# Patient Record
Sex: Male | Born: 1960 | Race: White | Hispanic: No | Marital: Married
Health system: Midwestern US, Community
[De-identification: ages and names within clinical notes are randomized; demographics above are authoritative.]

## PROBLEM LIST (undated history)

## (undated) DIAGNOSIS — E785 Hyperlipidemia, unspecified: Principal | ICD-10-CM

## (undated) DIAGNOSIS — R072 Precordial pain: Principal | ICD-10-CM

---

## 2005-10-04 ENCOUNTER — Ambulatory Visit (HOSPITAL_COMMUNITY): Admission: RE | Admit: 2005-10-04 | Discharge: 2005-10-05 | Payer: Self-pay | Admitting: Specialist

## 2006-03-21 ENCOUNTER — Ambulatory Visit: Payer: Self-pay | Admitting: Pulmonary Disease

## 2006-04-19 ENCOUNTER — Ambulatory Visit (HOSPITAL_BASED_OUTPATIENT_CLINIC_OR_DEPARTMENT_OTHER): Admission: RE | Admit: 2006-04-19 | Discharge: 2006-04-19 | Payer: Self-pay | Admitting: Pulmonary Disease

## 2006-04-19 ENCOUNTER — Ambulatory Visit: Payer: Self-pay | Admitting: Pulmonary Disease

## 2006-05-05 IMAGING — CR DG LUMBAR SPINE 1V
1 series · 1 of 1 positions shown · non-contrast
Comparison: none

CLINICAL DATA: Lumbar disc herniation. 
 LUMBAR SPINE ? 2 VIEW:

[view not recorded]
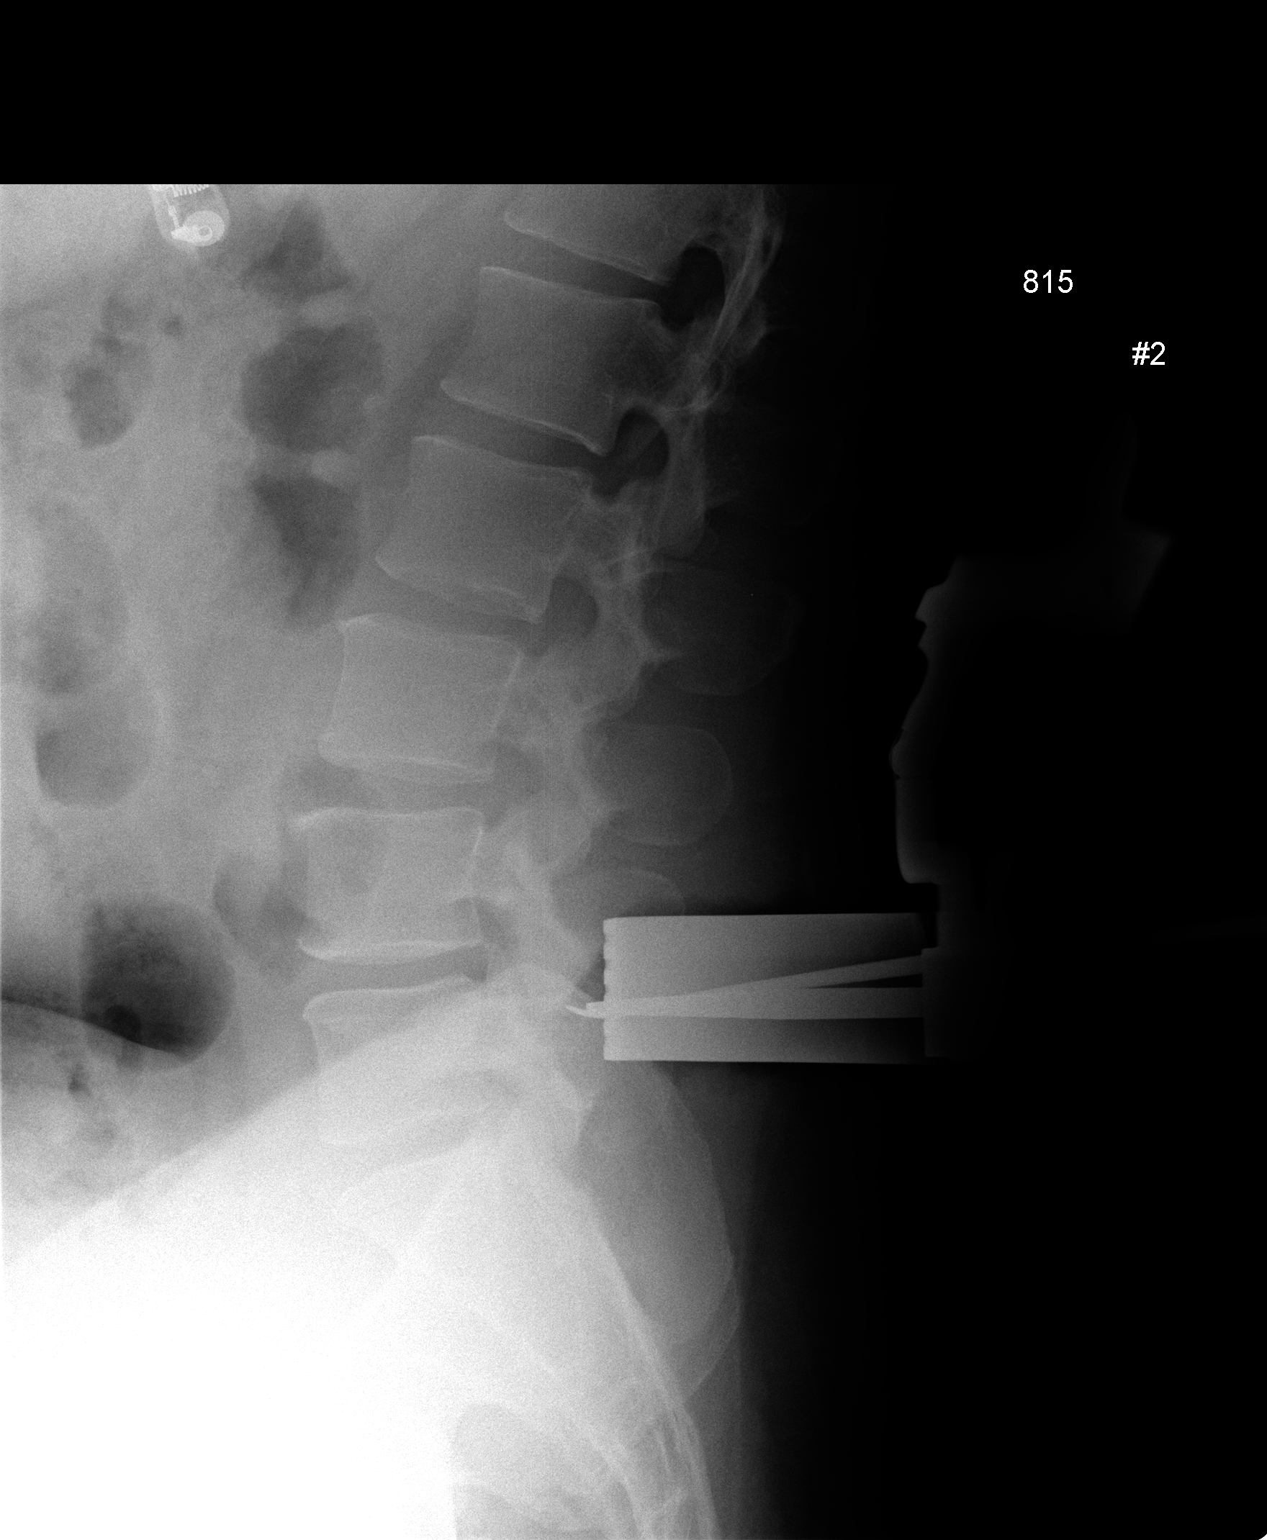

[1 of 1 positions shown; findings below may reference images not displayed]

FINDINGS: Intraoperative crosstable lateral radiograph #1 shows needles posteriorly directed at the spinous processes of L4 and S1.
 Intraoperative crosstable lateral radiograph #2 shows posterior retractors with an instrument overlying the spinal canal at the level of L4-5.
IMPRESSION: Intraoperative localization of L4-5.

## 2006-05-13 ENCOUNTER — Ambulatory Visit: Payer: Self-pay | Admitting: Pulmonary Disease

## 2007-12-23 ENCOUNTER — Telehealth (INDEPENDENT_AMBULATORY_CARE_PROVIDER_SITE_OTHER): Payer: Self-pay | Admitting: *Deleted

## 2017-10-02 ENCOUNTER — Telehealth: Payer: Self-pay | Admitting: Pulmonary Disease

## 2017-10-02 NOTE — Telephone Encounter (Signed)
Called and spoke with pt to see when it was that he had a HST done.  Pt stated to me that it was done sometime in 2007.  Looked through pt's chart to see if he was seen by one of our pulmonologists but pt has never been seen at our office.    Pt was admitted and [facility] from [facility].  There were CEMR conversation encounters that were signed by Dr. Craige CottaSood and one of those encounters was from the sleep disorders center.  Pt is stating that he needs to have his sleep study sent to [facility].  Advised pt that since it has been since 2007 since this was done, there might be problems getting ahold of his sleep study.  Called the sleep center to see if they had records of pt's sleep study and they saw where pt had the sleep study done in July 2007 but they had no records of pt's sleep study.  Called medical records and spoke with Marylu LundJanet to see if she could be able to find pt's sleep study. Marylu LundJanet said that she would have to pull up pt's paper chart to see if the sleep study is in there. Will send Marylu LundJanet an email to see if she can pull pt's sleep study so we can send it to [facility].  Will leave this encounter open until we are able to see if we can get pt's sleep study to send to the proper person.

## 2017-10-03 NOTE — Telephone Encounter (Signed)
Called and spoke with pt regarding to sleep study.  I have advised pt to contact medical records to obtain records. We are not able obtain sleep study, and pt has not been seen in our office. It appears that [facility] read the sleep study in 2007. Pt has been provided with medical records number. Pt voiced his understanding and had  No further questions. Nothing further is needed.

## 2017-10-07 NOTE — Telephone Encounter (Signed)
[facility] stating that the were unable to locate pt's paper chart. Marylu LundJanet stated to me that pt had called Medical Records and was told that paper chart was unable to be located.  Called pt and left him a message with this same information and told him to contact us if he has any questions.  Nothing further needed at this time.

## 2020-02-25 DIAGNOSIS — R0789 Other chest pain: Secondary | ICD-10-CM

## 2020-02-25 NOTE — ED Triage Notes (Signed)
Patient arrives via POV with spouse with mask in place.  States he played golf yesterday and started with chest/shoulder pain yesterday evening.  Also c/o nausea and loss of appetite. Currently with headache.  Denis SOB.

## 2020-02-25 NOTE — ED Notes (Signed)
Patient arrives via POV with spouse with mask in place.  States he played golf yesterday and started with chest/shoulder pain yesterday evening.  Also c/o nausea and loss of appetite. Currently with headache.  Denis SOB.

## 2020-02-26 ENCOUNTER — Inpatient Hospital Stay
Admit: 2020-02-26 | Discharge: 2020-02-26 | Disposition: A | Discharge status: Home / Self Care | Payer: BLUE CROSS/BLUE SHIELD | Attending: Emergency Medicine

## 2020-02-26 LAB — CBC WITH AUTO DIFFERENTIAL
Basophils %: 1 % (ref 0.0–2.0)
Basophils Absolute: 0.1 10*3/uL (ref 0.0–0.2)
Eosinophils %: 3 % (ref 0.5–7.8)
Eosinophils Absolute: 0.3 10*3/uL (ref 0.0–0.8)
Granulocyte Absolute Count: 0 10*3/uL (ref 0.0–0.5)
Hematocrit: 41.6 % (ref 41.1–50.3)
Hemoglobin: 14.3 g/dL (ref 13.6–17.2)
Immature Granulocytes: 0 % (ref 0.0–5.0)
Lymphocytes %: 29 % (ref 13–44)
Lymphocytes Absolute: 3.5 10*3/uL (ref 0.5–4.6)
MCH: 31 PG (ref 26.1–32.9)
MCHC: 34.4 g/dL (ref 31.4–35.0)
MCV: 90.2 FL (ref 79.6–97.8)
MPV: 10.2 FL (ref 9.4–12.3)
Monocytes %: 8 % (ref 4.0–12.0)
Monocytes Absolute: 0.9 10*3/uL (ref 0.1–1.3)
NRBC Absolute: 0 10*3/uL (ref 0.0–0.2)
Neutrophils %: 59 % (ref 43–78)
Neutrophils Absolute: 7 10*3/uL (ref 1.7–8.2)
Platelets: 257 10*3/uL (ref 150–450)
RBC: 4.61 M/uL (ref 4.23–5.6)
RDW: 13 % (ref 11.9–14.6)
WBC: 11.8 10*3/uL — ABNORMAL HIGH (ref 4.3–11.1)

## 2020-02-26 LAB — CK
CK: 508 U/L — ABNORMAL HIGH (ref 21–215)
Total CK: 508 U/L — ABNORMAL HIGH (ref 21–215)

## 2020-02-26 LAB — COMPREHENSIVE METABOLIC PANEL
ALT: 33 U/L (ref 12–65)
AST: 35 U/L (ref 15–37)
Albumin/Globulin Ratio: 0.9 — ABNORMAL LOW (ref 1.2–3.5)
Albumin: 3.6 g/dL (ref 3.5–5.0)
Alkaline Phosphatase: 82 U/L (ref 50–136)
Anion Gap: 9 mmol/L (ref 7–16)
BUN: 22 MG/DL (ref 6–23)
CO2: 24 mmol/L (ref 21–32)
Calcium: 8.5 MG/DL (ref 8.3–10.4)
Chloride: 108 mmol/L — ABNORMAL HIGH (ref 98–107)
Creatinine: 1.13 MG/DL (ref 0.8–1.5)
EGFR IF NonAfrican American: >60 mL/min/{1.73_m2} (ref >60)
GFR African American: >60 mL/min/{1.73_m2} (ref >60)
Globulin: 3.9 g/dL — ABNORMAL HIGH (ref 2.3–3.5)
Glucose: 110 mg/dL — ABNORMAL HIGH (ref 65–100)
Potassium: 4.1 mmol/L (ref 3.5–5.1)
Sodium: 141 mmol/L (ref 136–145)
Total Bilirubin: 0.5 MG/DL (ref 0.2–1.1)
Total Protein: 7.5 g/dL (ref 6.3–8.2)

## 2020-02-26 LAB — EKG 12-LEAD
Atrial Rate: 75 {beats}/min
Diagnosis: NORMAL
P Axis: 70 degrees
P-R Interval: 150 ms
Q-T Interval: 386 ms
QRS Duration: 78 ms
QTc Calculation (Bazett): 431 ms
R Axis: 40 degrees
T Axis: 36 degrees
Ventricular Rate: 75 {beats}/min

## 2020-02-26 LAB — TROPONIN, HIGH SENSITIVITY
Troponin, High Sensitivity: 8.1 pg/mL (ref 0–14)
Troponin, High Sensitivity: 8.3 pg/mL (ref 0–14)

## 2020-02-26 LAB — LIPASE
Lipase: 129 U/L (ref 73–393)
Lipase: 129 U/L (ref 73–393)

## 2020-02-26 LAB — MAGNESIUM
Magnesium: 2.1 mg/dL (ref 1.8–2.4)
Magnesium: 2.1 mg/dL (ref 1.8–2.4)

## 2020-02-26 LAB — PROBNP, N-TERMINAL: BNP: 20 PG/ML (ref 5–125)

## 2020-02-26 LAB — D-DIMER, QUANTITATIVE: D-Dimer, Quant: <0.27 ug/ml(FEU) (ref <0.56)

## 2020-02-26 LAB — CBC WITH AUTOMATED DIFF
ABS. BASOPHILS: 0.1 10*3/uL (ref 0.0–0.2)
ABS. EOSINOPHILS: 0.3 10*3/uL (ref 0.0–0.8)
ABS. IMM. GRANS.: 0 10*3/uL (ref 0.0–0.5)
ABS. LYMPHOCYTES: 3.5 10*3/uL (ref 0.5–4.6)
ABS. MONOCYTES: 0.9 10*3/uL (ref 0.1–1.3)
ABS. NEUTROPHILS: 7 10*3/uL (ref 1.7–8.2)
ABSOLUTE NRBC: 0 10*3/uL (ref 0.0–0.2)
BASOPHILS: 1 % (ref 0.0–2.0)
EOSINOPHILS: 3 % (ref 0.5–7.8)
HCT: 41.6 % (ref 41.1–50.3)
HGB: 14.3 g/dL (ref 13.6–17.2)
IMMATURE GRANULOCYTES: 0 % (ref 0.0–5.0)
LYMPHOCYTES: 29 % (ref 13–44)
MCH: 31 PG (ref 26.1–32.9)
MCHC: 34.4 g/dL (ref 31.4–35.0)
MCV: 90.2 FL (ref 79.6–97.8)
MONOCYTES: 8 % (ref 4.0–12.0)
MPV: 10.2 FL (ref 9.4–12.3)
NEUTROPHILS: 59 % (ref 43–78)
PLATELET: 257 10*3/uL (ref 150–450)
RBC: 4.61 M/uL (ref 4.23–5.6)
RDW: 13 % (ref 11.9–14.6)
WBC: 11.8 10*3/uL — ABNORMAL HIGH (ref 4.3–11.1)

## 2020-02-26 LAB — METABOLIC PANEL, COMPREHENSIVE
A-G Ratio: 0.9 — ABNORMAL LOW (ref 1.2–3.5)
ALT (SGPT): 33 U/L (ref 12–65)
AST (SGOT): 35 U/L (ref 15–37)
Albumin: 3.6 g/dL (ref 3.5–5.0)
Alk. phosphatase: 82 U/L (ref 50–136)
Anion gap: 9 mmol/L (ref 7–16)
BUN: 22 MG/DL (ref 6–23)
Bilirubin, total: 0.5 MG/DL (ref 0.2–1.1)
CO2: 24 mmol/L (ref 21–32)
Calcium: 8.5 MG/DL (ref 8.3–10.4)
Chloride: 108 mmol/L — ABNORMAL HIGH (ref 98–107)
Creatinine: 1.13 MG/DL (ref 0.8–1.5)
GFR est AA: >60 mL/min/{1.73_m2} (ref >60)
GFR est non-AA: >60 mL/min/{1.73_m2} (ref >60)
Globulin: 3.9 g/dL — ABNORMAL HIGH (ref 2.3–3.5)
Glucose: 110 mg/dL — ABNORMAL HIGH (ref 65–100)
Potassium: 4.1 mmol/L (ref 3.5–5.1)
Protein, total: 7.5 g/dL (ref 6.3–8.2)
Sodium: 141 mmol/L (ref 136–145)

## 2020-02-26 LAB — EKG, 12 LEAD, INITIAL
Atrial Rate: 75 {beats}/min
Calculated P Axis: 70 degrees
Calculated R Axis: 40 degrees
Calculated T Axis: 36 degrees
Diagnosis: NORMAL
P-R Interval: 150 ms
Q-T Interval: 386 ms
QRS Duration: 78 ms
QTC Calculation (Bezet): 431 ms
Ventricular Rate: 75 {beats}/min

## 2020-02-26 LAB — TROPONIN-HIGH SENSITIVITY
Troponin-High Sensitivity: 8.1 pg/mL (ref 0–14)
Troponin-High Sensitivity: 8.3 pg/mL (ref 0–14)

## 2020-02-26 LAB — D DIMER: D DIMER: <0.27 ug/ml(FEU) (ref <0.56)

## 2020-02-26 LAB — NT-PRO BNP: NT pro-BNP: 20 PG/ML (ref 5–125)

## 2020-02-26 MED ORDER — SODIUM CHLORIDE 0.9 % IV
Freq: Once | INTRAVENOUS | Status: AC
Start: 2020-02-26 — End: 2020-02-26
  Administered 2020-02-26: 05:00:00 via INTRAVENOUS

## 2020-02-26 MED ORDER — SODIUM CHLORIDE 0.9 % IJ SYRG
INTRAMUSCULAR | Status: DC | PRN
Start: 2020-02-26 — End: 2020-02-26

## 2020-02-26 MED ORDER — LANSOPRAZOLE 30 MG CAP, DELAYED RELEASE
30 mg | ORAL_CAPSULE | Freq: Every day | ORAL | 0 refills | Status: AC
Start: 2020-02-26 — End: 2020-03-17

## 2020-02-26 MED ORDER — SODIUM CHLORIDE 0.9 % IJ SYRG
Freq: Three times a day (TID) | INTRAMUSCULAR | Status: DC
Start: 2020-02-26 — End: 2020-02-26

## 2020-02-26 MED ORDER — ASPIRIN 81 MG CHEWABLE TAB
81 mg | ORAL_TABLET | Freq: Every day | ORAL | 0 refills | Status: AC
Start: 2020-02-26 — End: ?

## 2020-02-26 NOTE — ED Provider Notes (Signed)
58-year-old male complaint of chest pain shortness of breath.  Patient was playing golf in the sun 24 hours ago and since then has felt very tired dehydrated having some pain.  Patient also describes his arms as feeling after going to "explode".      Chest Pain (Angina)   This is a new problem. The current episode started yesterday. The problem has not changed since onset.The problem occurs constantly. The pain is associated with normal activity and lifting arms. The pain is at a severity of 5/10. The pain is moderate. The quality of the pain is described as dull. The pain does not radiate. The symptoms are aggravated by movement. Associated symptoms include cough, malaise/fatigue and shortness of breath. Pertinent negatives include no abdominal pain, no diaphoresis, no dizziness and no palpitations. Risk factors include male gender and hypertension.        No past medical history on file.    No past surgical history on file.      No family history on file.    Social History     Socioeconomic History   ??? Marital status: MARRIED     Spouse name: Not on file   ??? Number of children: Not on file   ??? Years of education: Not on file   ??? Highest education level: Not on file   Occupational History   ??? Not on file   Tobacco Use   ??? Smoking status: Not on file   Substance and Sexual Activity   ??? Alcohol use: Not on file   ??? Drug use: Not on file   ??? Sexual activity: Not on file   Other Topics Concern   ??? Not on file   Social History Narrative   ??? Not on file     Social Determinants of Health     Financial Resource Strain:    ??? Difficulty of Paying Living Expenses:    Food Insecurity:    ??? Worried About Running Out of Food in the Last Year:    ??? Ran Out of Food in the Last Year:    Transportation Needs:    ??? Lack of Transportation (Medical):    ??? Lack of Transportation (Non-Medical):    Physical Activity:    ??? Days of Exercise per Week:    ??? Minutes of Exercise per Session:    Stress:    ??? Feeling of Stress :    Social  Connections:    ??? Frequency of Communication with Friends and Family:    ??? Frequency of Social Gatherings with Friends and Family:    ??? Attends Religious Services:    ??? Active Member of Clubs or Organizations:    ??? Attends Club or Organization Meetings:    ??? Marital Status:    Intimate Partner Violence:    ??? Fear of Current or Ex-Partner:    ??? Emotionally Abused:    ??? Physically Abused:    ??? Sexually Abused:          ALLERGIES: Patient has no allergy information on record.    Review of Systems   Constitutional: Positive for malaise/fatigue. Negative for activity change and diaphoresis.   HENT: Negative.    Eyes: Negative.    Respiratory: Positive for cough and shortness of breath.    Cardiovascular: Positive for chest pain. Negative for palpitations.   Gastrointestinal: Negative.  Negative for abdominal pain.   Genitourinary: Negative.    Musculoskeletal: Negative.    Skin: Negative.      Neurological: Negative.  Negative for dizziness.   Psychiatric/Behavioral: Negative.    All other systems reviewed and are negative.      Vitals:    02/25/20 2132   BP: (!) 148/74   Pulse: 77   Resp: 17   Temp: 98.1 ??F (36.7 ??C)   SpO2: 94%   Weight: 122.5 kg (270 lb)   Height: 5\' 9"  (1.753 m)            Physical Exam  Vitals and nursing note reviewed.   Constitutional:       General: He is not in acute distress.     Appearance: He is well-developed.   HENT:      Head: Normocephalic and atraumatic.      Right Ear: External ear normal.      Left Ear: External ear normal.      Nose: Nose normal.   Eyes:      General: No scleral icterus.        Right eye: No discharge.         Left eye: No discharge.      Conjunctiva/sclera: Conjunctivae normal.      Pupils: Pupils are equal, round, and reactive to light.   Cardiovascular:      Rate and Rhythm: Regular rhythm.   Pulmonary:      Effort: Pulmonary effort is normal. No respiratory distress.      Breath sounds: Normal breath sounds. No stridor. No wheezing or rales.   Abdominal:       General: Bowel sounds are normal. There is no distension.      Palpations: Abdomen is soft.      Tenderness: There is no abdominal tenderness.   Musculoskeletal:         General: Normal range of motion.      Cervical back: Normal range of motion.   Skin:     General: Skin is warm and dry.      Findings: No rash.   Neurological:      Mental Status: He is alert and oriented to person, place, and time.      Motor: No abnormal muscle tone.      Coordination: Coordination normal.   Psychiatric:         Behavior: Behavior normal.          MDM  Number of Diagnoses or Management Options  Atypical chest pain  Dehydration  Elevated CK  Diagnosis management comments: Patient had elevated CK most likely from playing golf in the hot sun.  He was hydrated in the emergency department chest pain was relieved in his work-up was negative.    Differential diagnosis: AMI, angina, pleuritic chest pain, musculoskeletal pain.  This list may not include all the diagnosis is considered.       Amount and/or Complexity of Data Reviewed  Clinical lab tests: ordered and reviewed  Tests in the radiology section of CPT??: ordered and reviewed  Tests in the medicine section of CPT??: reviewed and ordered  Decide to obtain previous medical records or to obtain history from someone other than the patient: yes  Review and summarize past medical records: yes  Independent visualization of images, tracings, or specimens: yes    Risk of Complications, Morbidity, and/or Mortality  Presenting problems: high  Diagnostic procedures: high  Management options: high    Patient Progress  Patient progress: stable         Procedures

## 2020-02-26 NOTE — ED Notes (Signed)
I have reviewed discharge instructions with the patient.  The patient verbalized understanding.    Patient left ED via Discharge Method: ambulatory to Home family    Opportunity for questions and clarification provided.       Patient given 2 scripts.         To continue your aftercare when you leave the hospital, you may receive an automated call from our care team to check in on how you are doing.  This is a free service and part of our promise to provide the best care and service to meet your aftercare needs.??? If you have questions, or wish to unsubscribe from this service please call [phone].  Thank you for Choosing our [facility].

## 2020-02-26 NOTE — ED Notes (Signed)
I have reviewed discharge instructions with the patient.  The patient verbalized understanding.    Patient left ED via Discharge Method: ambulatory to Home family  Opportunity for questions and clarification provided.       Patient given 2 scripts.         To continue your aftercare when you leave the hospital, you may receive an automated call from our care team to check in on how you are doing.  This is a free service and part of our promise to provide the best care and service to meet your aftercare needs." If you have questions, or wish to unsubscribe from this service please call [phone].  Thank you for Choosing our [facility].

## 2020-02-26 NOTE — ED Provider Notes (Signed)
59 year old male complaint of chest pain shortness of breath.  Patient was playing golf in the sun 24 hours ago and since then has felt very tired dehydrated having some pain.  Patient also describes his arms as feeling after going to "explode".      Chest Pain (Angina)   This is a new problem. The current episode started yesterday. The problem has not changed since onset.The problem occurs constantly. The pain is associated with normal activity and lifting arms. The pain is at a severity of 5/10. The pain is moderate. The quality of the pain is described as dull. The pain does not radiate. The symptoms are aggravated by movement. Associated symptoms include cough, malaise/fatigue and shortness of breath. Pertinent negatives include no abdominal pain, no diaphoresis, no dizziness and no palpitations. Risk factors include male gender and hypertension.        No past medical history on file.    No past surgical history on file.      No family history on file.    Social History     Socioeconomic History   ??? Marital status: MARRIED     Spouse name: Not on file   ??? Number of children: Not on file   ??? Years of education: Not on file   ??? Highest education level: Not on file   Occupational History   ??? Not on file   Tobacco Use   ??? Smoking status: Not on file   Substance and Sexual Activity   ??? Alcohol use: Not on file   ??? Drug use: Not on file   ??? Sexual activity: Not on file   Other Topics Concern   ??? Not on file   Social History Narrative   ??? Not on file     Social Determinants of Health     Financial Resource Strain:    ??? Difficulty of Paying Living Expenses:    Food Insecurity:    ??? Worried About Charity fundraiser in the Last Year:    ??? Arboriculturist in the Last Year:    Transportation Needs:    ??? Film/video editor (Medical):    ??? Lack of Transportation (Non-Medical):    Physical Activity:    ??? Days of Exercise per Week:    ??? Minutes of Exercise per Session:    Stress:    ??? Feeling of Stress :    Social  Connections:    ??? Frequency of Communication with Friends and Family:    ??? Frequency of Social Gatherings with Friends and Family:    ??? Attends Religious Services:    ??? Marine scientist or Organizations:    ??? Attends Music therapist:    ??? Marital Status:    Intimate Production manager Violence:    ??? Fear of Current or Ex-Partner:    ??? Emotionally Abused:    ??? Physically Abused:    ??? Sexually Abused:          ALLERGIES: Patient has no allergy information on record.    Review of Systems   Constitutional: Positive for malaise/fatigue. Negative for activity change and diaphoresis.   HENT: Negative.    Eyes: Negative.    Respiratory: Positive for cough and shortness of breath.    Cardiovascular: Positive for chest pain. Negative for palpitations.   Gastrointestinal: Negative.  Negative for abdominal pain.   Genitourinary: Negative.    Musculoskeletal: Negative.    Skin: Negative.  Neurological: Negative.  Negative for dizziness.   Psychiatric/Behavioral: Negative.    All other systems reviewed and are negative.      Vitals:    02/25/20 2132   BP: (!) 148/74   Pulse: 77   Resp: 17   Temp: 98.1 ??F (36.7 ??C)   SpO2: 94%   Weight: 122.5 kg (270 lb)   Height: 5\' 9"  (1.753 m)            Physical Exam  Vitals and nursing note reviewed.   Constitutional:       General: He is not in acute distress.     Appearance: He is well-developed.   HENT:      Head: Normocephalic and atraumatic.      Right Ear: External ear normal.      Left Ear: External ear normal.      Nose: Nose normal.   Eyes:      General: No scleral icterus.        Right eye: No discharge.         Left eye: No discharge.      Conjunctiva/sclera: Conjunctivae normal.      Pupils: Pupils are equal, round, and reactive to light.   Cardiovascular:      Rate and Rhythm: Regular rhythm.   Pulmonary:      Effort: Pulmonary effort is normal. No respiratory distress.      Breath sounds: Normal breath sounds. No stridor. No wheezing or rales.   Abdominal:       General: Bowel sounds are normal. There is no distension.      Palpations: Abdomen is soft.      Tenderness: There is no abdominal tenderness.   Musculoskeletal:         General: Normal range of motion.      Cervical back: Normal range of motion.   Skin:     General: Skin is warm and dry.      Findings: No rash.   Neurological:      Mental Status: He is alert and oriented to person, place, and time.      Motor: No abnormal muscle tone.      Coordination: Coordination normal.   Psychiatric:         Behavior: Behavior normal.          MDM  Number of Diagnoses or Management Options  Atypical chest pain  Dehydration  Elevated CK  Diagnosis management comments: Patient had elevated CK most likely from playing golf in the hot sun.  He was hydrated in the emergency department chest pain was relieved in his work-up was negative.    Differential diagnosis: AMI, angina, pleuritic chest pain, musculoskeletal pain.  This list may not include all the diagnosis is considered.       Amount and/or Complexity of Data Reviewed  Clinical lab tests: ordered and reviewed  Tests in the radiology section of CPT??: ordered and reviewed  Tests in the medicine section of CPT??: reviewed and ordered  Decide to obtain previous medical records or to obtain history from someone other than the patient: yes  Review and summarize past medical records: yes  Independent visualization of images, tracings, or specimens: yes    Risk of Complications, Morbidity, and/or Mortality  Presenting problems: high  Diagnostic procedures: high  Management options: high    Patient Progress  Patient progress: stable         Procedures

## 2020-03-18 ENCOUNTER — Ambulatory Visit: Attending: Cardiovascular Disease | Primary: Internal Medicine

## 2020-03-18 ENCOUNTER — Ambulatory Visit
Admit: 2020-03-18 | Discharge: 2020-03-18 | Payer: BLUE CROSS/BLUE SHIELD | Attending: Cardiovascular Disease | Primary: Internal Medicine

## 2020-03-18 DIAGNOSIS — R072 Precordial pain: Secondary | ICD-10-CM

## 2020-03-18 NOTE — Progress Notes (Signed)
[place] [place]  [address], [address]  [place] [address]  PHONE: [phone]           HISTORY AND PHYSICAL          SUBJECTIVE:   Jose Lane is a 59 y.o. male seen for a consultation visit regarding the following:     Chief Complaint   Patient presents with   ??? Chest Pain (Angina)   ??? New Patient            HPI:  Had an episode of cp while playing golf in high heat. Fatigue and then the next night developed arm pain bilateral. Some associated right sided cp. No recurrence of the cp.    No doe. No orthopnea or pnd. No palpitations or syncope.  .    Outpatient Medications Marked as Taking for the 03/18/20 encounter (Office Visit) with Doran Stabler, MD   Medication Sig Dispense Refill   ??? atorvastatin (Lipitor) 40 mg tablet Take  by mouth daily.     ??? multivitamin (ONE A DAY) tablet Take 1 Tablet by mouth daily. Immune defense vitamin     ??? aspirin 81 mg chewable tablet Take 1 Tablet by mouth daily. 30 Tablet 0     No Known Allergies  Past Medical History:   Diagnosis Date   ??? Hyperlipidemia      No past surgical history on file.  Family History   Problem Relation Age of Onset   ??? Stroke Father      Social History     Tobacco Use   ??? Smoking status: Never Smoker   Substance Use Topics   ??? Alcohol use: Not on file         ROS:    Constitution: Negative for fever.   Eyes: Negative for blurred vision.   Respiratory: Negative for cough.    Endocrine: Negative for cold intolerance and heat intolerance.   Skin: Negative for rash.   Musculoskeletal: Negative for myalgias.   Gastrointestinal: Negative for diarrhea, nausea and vomiting.   Genitourinary: Negative for dysuria.   Neurological: Negative for headaches and numbness.          PHYSICAL EXAM:       Visit Vitals  BP 124/72   Pulse 80   Ht '5\' 9"'  (1.753 m)   Wt 285 lb (129.3 kg)   BMI 42.09 kg/m??      Constitutional: Oriented to person, place, and time. Appears well-developed and well-nourished.   Head: Normocephalic and atraumatic.   Neck:  Neck supple.   Cardiovascular: Normal rate and regular rhythm with no murmur -No JVP  Pulmonary/Chest: Breath sounds normal.   Abdominal: Soft.   Musculoskeletal: No edema.   Neurological: Alert and oriented to person, place, and time.   Skin: Skin is warm and dry.   Psychiatric: Normal mood and affect.   Vitals reviewed        Medical problems and test results were reviewed with the patient today.     No results found for any visits on 03/18/20.      Recent Results (from the past 672 hour(s))   TROPONIN-HIGH SENSITIVITY    Collection Time: 02/25/20  9:39 PM   Result Value Ref Range    Troponin-High Sensitivity 8.1 0 - 14 pg/mL   CBC WITH AUTOMATED DIFF    Collection Time: 02/25/20  9:39 PM   Result Value Ref Range    WBC 11.8 (H) 4.3 - 11.1 K/uL  RBC 4.61 4.23 - 5.6 M/uL    HGB 14.3 13.6 - 17.2 g/dL    HCT 41.6 41.1 - 50.3 %    MCV 90.2 79.6 - 97.8 FL    MCH 31.0 26.1 - 32.9 PG    MCHC 34.4 31.4 - 35.0 g/dL    RDW 13.0 11.9 - 14.6 %    PLATELET 257 150 - 450 K/uL    MPV 10.2 9.4 - 12.3 FL    ABSOLUTE NRBC 0.00 0.0 - 0.2 K/uL    DF AUTOMATED      NEUTROPHILS 59 43 - 78 %    LYMPHOCYTES 29 13 - 44 %    MONOCYTES 8 4.0 - 12.0 %    EOSINOPHILS 3 0.5 - 7.8 %    BASOPHILS 1 0.0 - 2.0 %    IMMATURE GRANULOCYTES 0 0.0 - 5.0 %    ABS. NEUTROPHILS 7.0 1.7 - 8.2 K/UL    ABS. LYMPHOCYTES 3.5 0.5 - 4.6 K/UL    ABS. MONOCYTES 0.9 0.1 - 1.3 K/UL    ABS. EOSINOPHILS 0.3 0.0 - 0.8 K/UL    ABS. BASOPHILS 0.1 0.0 - 0.2 K/UL    ABS. IMM. GRANS. 0.0 0.0 - 0.5 K/UL   METABOLIC PANEL, COMPREHENSIVE    Collection Time: 02/25/20  9:39 PM   Result Value Ref Range    Sodium 141 136 - 145 mmol/L    Potassium 4.1 3.5 - 5.1 mmol/L    Chloride 108 (H) 98 - 107 mmol/L    CO2 24 21 - 32 mmol/L    Anion gap 9 7 - 16 mmol/L    Glucose 110 (H) 65 - 100 mg/dL    BUN 22 6 - 23 MG/DL    Creatinine 1.13 0.8 - 1.5 MG/DL    GFR est AA >60 >60 ml/min/1.2m    GFR est non-AA >60 >60 ml/min/1.71m   Calcium 8.5 8.3 - 10.4 MG/DL    Bilirubin, total 0.5 0.2  - 1.1 MG/DL    ALT (SGPT) 33 12 - 65 U/L    AST (SGOT) 35 15 - 37 U/L    Alk. phosphatase 82 50 - 136 U/L    Protein, total 7.5 6.3 - 8.2 g/dL    Albumin 3.6 3.5 - 5.0 g/dL    Globulin 3.9 (H) 2.3 - 3.5 g/dL    A-G Ratio 0.9 (L) 1.2 - 3.5     LIPASE    Collection Time: 02/25/20  9:39 PM   Result Value Ref Range    Lipase 129 73 - 393 U/L   MAGNESIUM    Collection Time: 02/25/20  9:39 PM   Result Value Ref Range    Magnesium 2.1 1.8 - 2.4 mg/dL   CK    Collection Time: 02/25/20  9:39 PM   Result Value Ref Range    CK 508 (H) 21 - 215 U/L   EKG, 12 LEAD, INITIAL    Collection Time: 02/25/20  9:39 PM   Result Value Ref Range    Ventricular Rate 75 BPM    Atrial Rate 75 BPM    P-R Interval 150 ms    QRS Duration 78 ms    Q-T Interval 386 ms    QTC Calculation (Bezet) 431 ms    Calculated P Axis 70 degrees    Calculated R Axis 40 degrees    Calculated T Axis 36 degrees    Diagnosis       Normal sinus rhythm  Low  voltage QRS  Borderline ECG  No previous ECGs available  Confirmed by BITTRICK  MD ([facility]), Orland Penman [id number]) on 02/26/2020 7:04:07 AM     D DIMER    Collection Time: 02/25/20 11:32 PM   Result Value Ref Range    D DIMER <0.27 <0.56 ug/ml(FEU)   TROPONIN-HIGH SENSITIVITY    Collection Time: 02/25/20 11:32 PM   Result Value Ref Range    Troponin-High Sensitivity 8.3 0 - 14 pg/mL   NT-PRO BNP    Collection Time: 02/25/20 11:32 PM   Result Value Ref Range    NT pro-BNP 20 5 - 125 PG/ML     No results found for: CHOL, CHOLPOCT, CHOLX, CHLST, CHOLV, HDL, HDLPOC, HDLP, LDL, LDLCPOC, LDLC, DLDLP, VLDLC, VLDL, TGLX, TRIGL, TRIGP, TGLPOCT, CHHD, CHHDX    Outside records reviewed by me and summarized-normal er visit- normal ekg visit    ASSESSMENT and PLAN  1. Precordial pain  new  - NUCLEAR CARDIAC STRESS TEST; Future          Follow-up and Dispositions    ?? Return for follow-up after testing.               Thank you for allowing me to participate in this patient's care.  Please call or contact me if there are any questions or  concerns regarding the above.      Doran Stabler, MD  03/18/20  1:41 PM

## 2020-04-14 ENCOUNTER — Ambulatory Visit

## 2020-04-14 ENCOUNTER — Ambulatory Visit: Admit: 2020-04-14 | Payer: BLUE CROSS/BLUE SHIELD | Primary: Internal Medicine

## 2020-04-14 DIAGNOSIS — R072 Precordial pain: Secondary | ICD-10-CM

## 2020-04-14 MED ORDER — TECHNETIUM TC-99M SESTAMIBI (CARDIOLITE) INJECTION WITH DILUTION KIT
Freq: Once | INTRAVENOUS | Status: AC
Start: 2020-04-14 — End: 2020-04-14
  Administered 2020-04-14: 19:00:00 via INTRAVENOUS

## 2020-04-15 ENCOUNTER — Encounter: Admit: 2020-04-15 | Payer: BLUE CROSS/BLUE SHIELD | Primary: Internal Medicine

## 2020-04-15 MED ORDER — TECHNETIUM TC-99M SESTAMIBI (CARDIOLITE) INJECTION WITH DILUTION KIT
Freq: Once | INTRAVENOUS | Status: AC
Start: 2020-04-15 — End: 2020-04-15
  Administered 2020-04-15: 12:00:00 via INTRAVENOUS

## 2020-04-18 LAB — NM STRESS TEST WITH MYOCARDIAL PERFUSION
Angina Index: 0
Baseline Diastolic BP: 76 mmHg
Baseline HR: 63 {beats}/min
Baseline Systolic BP: 130 mmHg
Duke Treadmill Score: 8
Exercise Duration Time: 8:0 {titer}
Left Ventricular Ejection Fraction: 64
Stress Diastolic BP: 74 mmHg
Stress Estimated Workload: 9 METS
Stress Peak HR: 150 {beats}/min
Stress Percent HR Achieved: 92 %
Stress Rate Pressure Product: 21900 bpm*mmHg
Stress ST Depression: 0 mm
Stress ST Elevation: 0 mm
Stress Systolic BP: 146 mmHg
Stress Target HR: 163 {beats}/min

## 2020-04-18 LAB — NUCLEAR CARDIAC STRESS TEST
Angina Index: 0
Baseline Diastolic BP: 76 mmHg
Baseline HR: 63 {beats}/min
Baseline Systolic BP: 130 mmHg
Duke Treadmill Score: 8
Exercise Duration Time: 8:0 {titer}
Stress Diastolic BP: 74 mmHg
Stress Estimated Workload: 9 METS
Stress Peak HR: 150 {beats}/min
Stress Percent HR Achieved: 92 %
Stress Rate Pressure Product: 21900 bpm*mmHg
Stress ST Depression: 0 mm
Stress ST Elevation: 0 mm
Stress Systolic BP: 146 mmHg
Stress Target HR: 163 {beats}/min

## 2020-04-22 ENCOUNTER — Ambulatory Visit: Attending: Cardiovascular Disease | Primary: Internal Medicine

## 2020-04-22 ENCOUNTER — Ambulatory Visit
Admit: 2020-04-22 | Discharge: 2020-04-22 | Payer: BLUE CROSS/BLUE SHIELD | Attending: Cardiovascular Disease | Primary: Internal Medicine

## 2020-04-22 DIAGNOSIS — R072 Precordial pain: Secondary | ICD-10-CM

## 2020-04-22 NOTE — Progress Notes (Signed)
Progress Notes by Hendricks Limes, MD at 04/22/20 0900                Author: Hendricks Limes, MD  Service: --  Author Type: Physician       Filed: 04/22/20 0926  Encounter Date: 04/22/2020  Status: Signed          Editor: Hendricks Limes, MD (Physician)                          [facility]   [address], [address]   [place], [place] [address]   PHONE: [phone]         04/22/20      NAME:  Jose Lane   DOB: [birth year]   MRN: [id number]          SUBJECTIVE:    Jose Lane is a 59 y.o.  male seen for a follow up visit regarding the following:         Chief Complaint       Patient presents with        ?  Follow Up Chronic Condition     ?  Results             nuke follow up              HPI:      No cp or doe. No orthopnea or pnd. No palpitations or syncope.         Past Medical History, Past Surgical History, Family history, Social History, and Medications were all reviewed with the patient today and updated as necessary.         Current Outpatient Medications          Medication  Sig  Dispense  Refill           ?  lansoprazole (Prevacid) 15 mg capsule  Take 15 mg by mouth Daily (before breakfast).         ?  atorvastatin (Lipitor) 40 mg tablet  Take 40 mg by mouth daily.         ?  multivitamin (ONE A DAY) tablet  Take 1 Tablet by mouth daily. Immune defense vitamin               ?  aspirin 81 mg chewable tablet  Take 1 Tablet by mouth daily.  30 Tablet  0                         Social History          Tobacco Use         ?  Smoking status:  Never Smoker     ?  Smokeless tobacco:  Never Used       Substance Use Topics         ?  Alcohol use:  Not on file                     PHYSICAL EXAM:       Visit Vitals      BP  116/72     Pulse  72     Ht  5\' 9"  (1.753 m)     Wt  285 lb (129.3 kg)        BMI  42.09 kg/m??         Constitutional: Oriented to person, place, and time. Appears well-developed and well-nourished.  Head: Normocephalic and atraumatic.    Neck: Neck supple.     Cardiovascular: Normal rate and regular rhythm with no murmur -No JVP   Pulmonary/Chest: Breath sounds normal.    Abdominal: Soft.    Musculoskeletal: No edema.   Neurological: Alert and oriented to person, place, and time.    Skin: Skin is warm and dry.   Psychiatric: Normal mood and affect.    Vitals reviewed.           Wt Readings from Last 3 Encounters:        04/22/20  285 lb (129.3 kg)     04/14/20  285 lb (129.3 kg)        03/18/20  285 lb (129.3 kg)           Medical problems and test results were reviewed with the patient today.          No results found for this visit on 04/22/20.      ASSESSMENT and PLAN      1. Precordial pain   Resolved. Continue current meds   Normal stress test      2. dyslipidemia   Stable- carotid US           Follow-up and Dispositions      ??  Return if symptoms worsen or fail to improve.                         Hendricks Limes, MD   04/22/2020   9:17 AM

## 2020-05-31 ENCOUNTER — Encounter: Primary: Internal Medicine

## 2020-06-02 ENCOUNTER — Ambulatory Visit

## 2020-06-02 ENCOUNTER — Encounter

## 2020-06-02 ENCOUNTER — Ambulatory Visit: Admit: 2020-06-02 | Payer: BLUE CROSS/BLUE SHIELD | Primary: Internal Medicine

## 2020-06-02 DIAGNOSIS — E785 Hyperlipidemia, unspecified: Secondary | ICD-10-CM

## 2020-06-02 LAB — VAS DUP CAROTID BILATERAL
Left CCA dist PSV: 103 cm/s
Left CCA prox PSV: 124 cm/s
Left ECA PSV: 89.2 cm/s
Left ICA dist EDV: 26.6 cm/s
Left ICA dist PSV: 84.1 cm/s
Left ICA mid EDV: 29.4 cm/s
Left ICA mid PSV: 84.7 cm/s
Left ICA prox EDV: 22 cm/s
Left ICA prox PSV: 74.3 cm/s
Left ICA/CCA PSV: 0.8
Left arm BP: 128 mmHg
Left vertebral PSV: 42.2 cm/s
Right CCA prox PSV: 148 cm/s
Right ECA PSV: 164 cm/s
Right ICA dist EDV: 31.3 cm/s
Right ICA dist PSV: 98.8 cm/s
Right ICA mid EDV: 29.4 cm/s
Right ICA mid PSV: 78.9 cm/s
Right ICA prox EDV: 27 cm/s
Right ICA prox PSV: 91 cm/s
Right ICA/CCA PSV: 0.9
Right arm BP: 123 mmHg
Right cca dist PSV: 109 cm/s
Right vertebral PSV: 56.6 cm/s

## 2020-06-02 LAB — DUPLEX CAROTID BILATERAL
Left CCA dist sys: 103 cm/s
Left CCA prox sys: 124 cm/s
Left ECA sys: 89.2 cm/s
Left ICA dist dias: 26.6 cm/s
Left ICA dist sys: 84.1 cm/s
Left ICA mid dias: 29.4 cm/s
Left ICA mid sys: 84.7 cm/s
Left ICA prox dias: 22 cm/s
Left ICA prox sys: 74.3 cm/s
Left ICA/CCA sys: 0.8
Left arm BP: 128 mmHg
Left vertebral sys: 42.2 cm/s
Right CCA prox sys: 148 cm/s
Right ICA dist dias: 31.3 cm/s
Right ICA dist sys: 98.8 cm/s
Right ICA mid dias: 29.4 cm/s
Right ICA mid sys: 78.9 cm/s
Right ICA prox dias: 27 cm/s
Right ICA prox sys: 91 cm/s
Right ICA/CCA sys: 0.9
Right arm BP: 123 mmHg
Right cca dist sys: 109 cm/s
Right eca sys: 164 cm/s
Right vertebral sys: 56.6 cm/s

## 2020-06-08 NOTE — Telephone Encounter (Signed)
-----   Message from Hendricks Limes, MD sent at 04/22/2020  9:23 AM EDT -----   Call with carotids

## 2020-06-08 NOTE — Telephone Encounter (Signed)
Informed patient of carotid results.

## 2020-06-08 NOTE — Telephone Encounter (Signed)
There was no evidence of any narrowing.  Good news
